# Patient Record
Sex: Male | Born: 1950 | Race: White | Hispanic: No | Marital: Married
Health system: Southern US, Community
[De-identification: ages and names within clinical notes are randomized; demographics above are authoritative.]

---

## 2006-12-02 ENCOUNTER — Ambulatory Visit (HOSPITAL_COMMUNITY): Admission: RE | Admit: 2006-12-02 | Discharge: 2006-12-03 | Payer: Self-pay | Admitting: Neurosurgery

## 2007-04-06 ENCOUNTER — Encounter: Admission: RE | Admit: 2007-04-06 | Discharge: 2007-04-06 | Payer: Self-pay | Admitting: Neurosurgery

## 2007-05-19 ENCOUNTER — Inpatient Hospital Stay (HOSPITAL_COMMUNITY): Admission: RE | Admit: 2007-05-19 | Discharge: 2007-05-20 | Payer: Self-pay | Admitting: Neurosurgery

## 2007-05-20 ENCOUNTER — Ambulatory Visit (HOSPITAL_COMMUNITY): Admission: RE | Admit: 2007-05-20 | Discharge: 2007-05-20 | Payer: Self-pay | Admitting: Neurosurgery

## 2007-06-20 ENCOUNTER — Encounter: Admission: RE | Admit: 2007-06-20 | Discharge: 2007-06-20 | Payer: Self-pay | Admitting: Neurosurgery

## 2007-08-24 ENCOUNTER — Encounter: Admission: RE | Admit: 2007-08-24 | Discharge: 2007-08-24 | Payer: Self-pay | Admitting: Neurosurgery

## 2008-03-28 ENCOUNTER — Encounter: Admission: RE | Admit: 2008-03-28 | Discharge: 2008-03-28 | Payer: Self-pay | Admitting: Neurosurgery

## 2008-05-22 ENCOUNTER — Ambulatory Visit: Payer: Self-pay | Admitting: Family Medicine

## 2008-05-22 DIAGNOSIS — K219 Gastro-esophageal reflux disease without esophagitis: Secondary | ICD-10-CM

## 2008-05-22 DIAGNOSIS — I10 Essential (primary) hypertension: Secondary | ICD-10-CM | POA: Insufficient documentation

## 2008-05-22 DIAGNOSIS — M109 Gout, unspecified: Secondary | ICD-10-CM

## 2008-05-22 DIAGNOSIS — S8010XA Contusion of unspecified lower leg, initial encounter: Secondary | ICD-10-CM | POA: Insufficient documentation

## 2008-05-22 DIAGNOSIS — E785 Hyperlipidemia, unspecified: Secondary | ICD-10-CM | POA: Insufficient documentation

## 2008-12-12 ENCOUNTER — Encounter: Admission: RE | Admit: 2008-12-12 | Discharge: 2008-12-12 | Payer: Self-pay | Admitting: Neurosurgery

## 2009-09-06 IMAGING — CR DG CHEST 2V
2 series · 2 of 2 positions shown · non-contrast
Comparison: 12/01/06.

CLINICAL DATA: 56-year-old female, preadmission for cervical fusion.
 CHEST - 2 VIEW:

[view not recorded (1 of 2)]
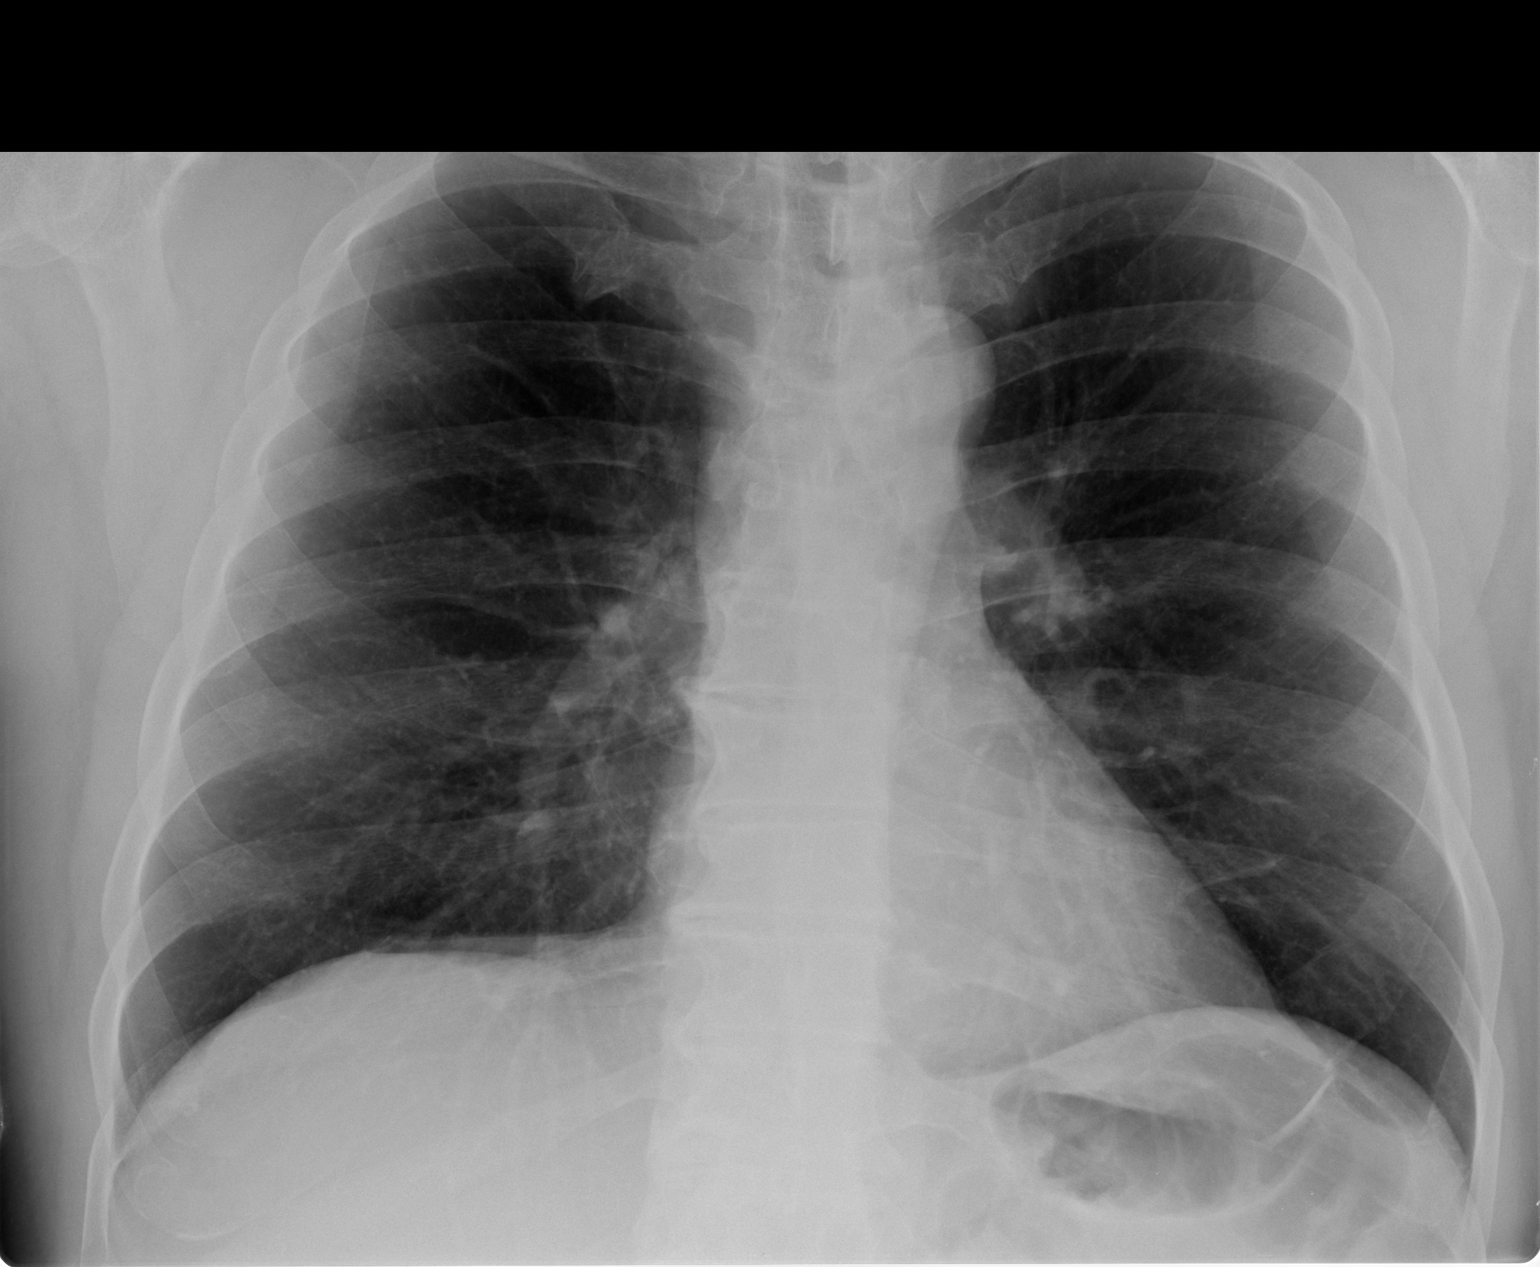

[view not recorded (2 of 2)]
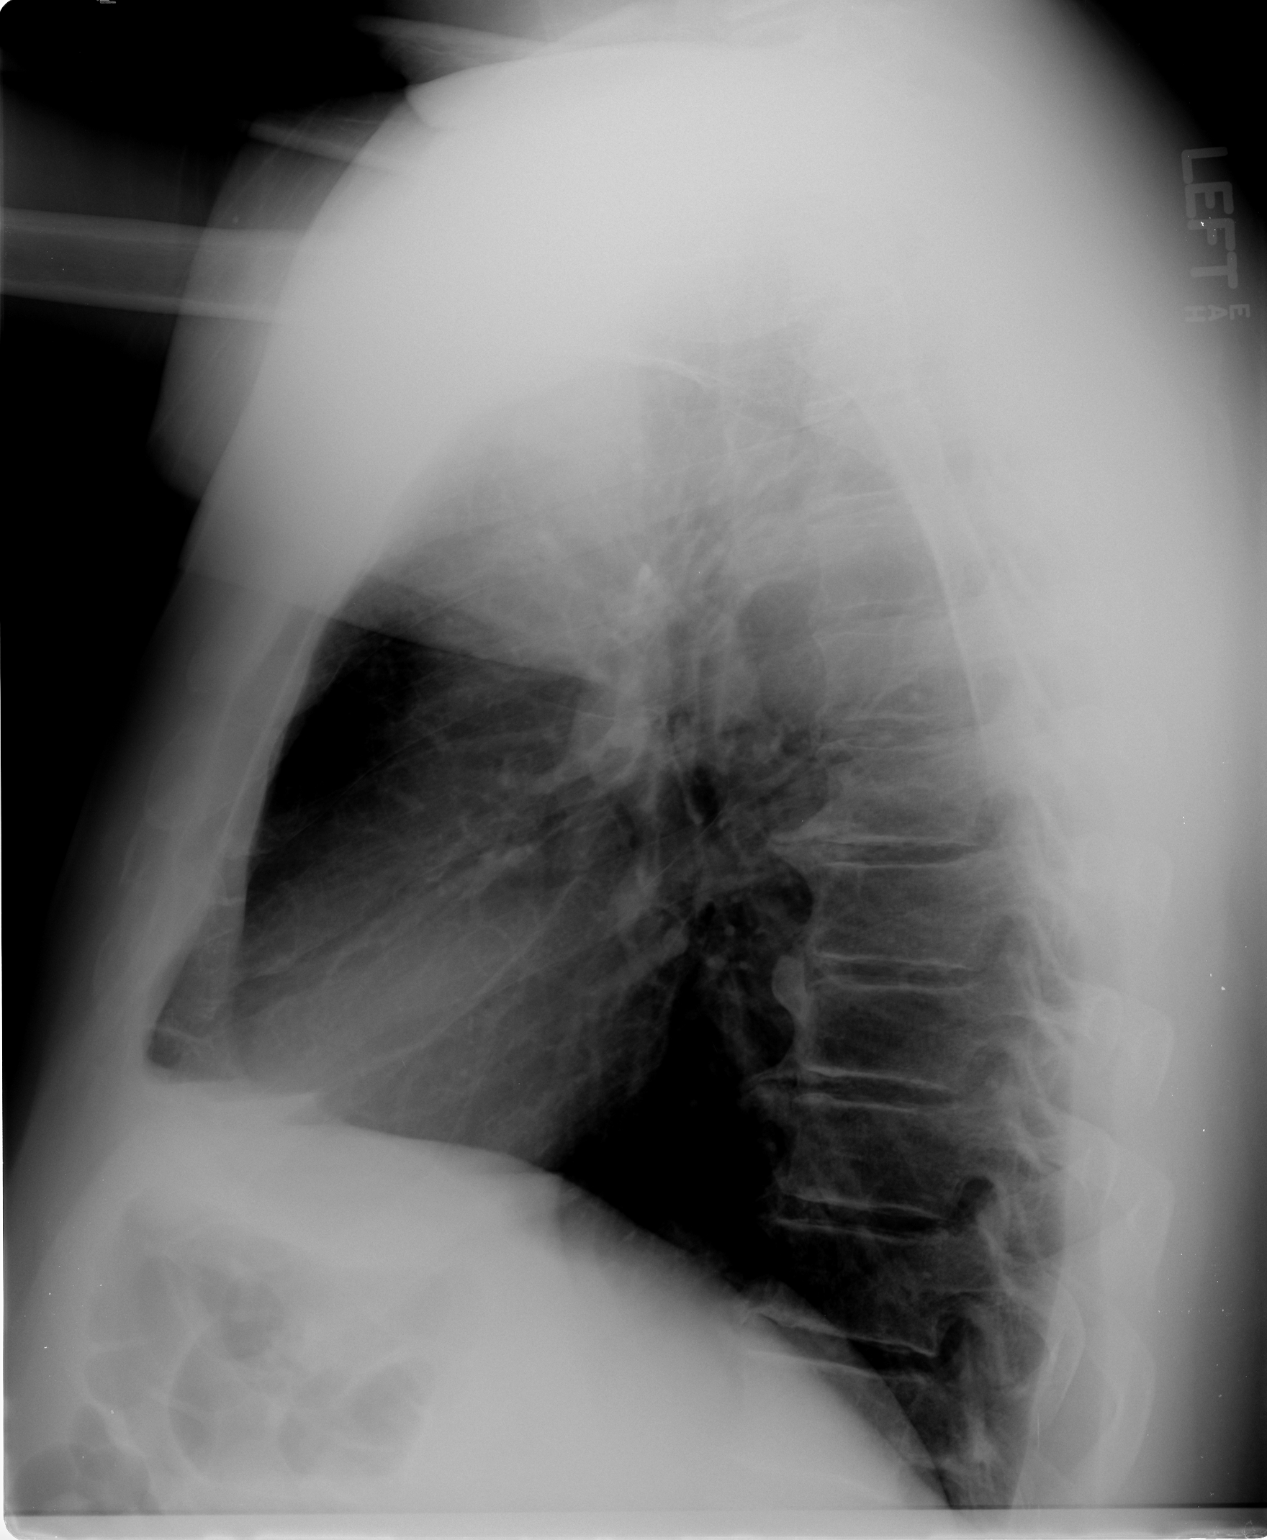

[2 of 2 positions shown; findings below may reference images not displayed]

FINDINGS: Stable, normal cardiac size.  Normal mediastinal contour.  Stable multilevel degenerative changes with osteophytosis in the thoracic spine.  The lungs are clear.
IMPRESSION: No acute cardiopulmonary abnormality.

## 2010-06-29 ENCOUNTER — Encounter: Payer: Self-pay | Admitting: Neurosurgery

## 2010-10-20 NOTE — Op Note (Signed)
NAMEINGVALD, THEISEN                 ACCOUNT NO.:  [id number]   MEDICAL RECORD NO.:  [id number]          PATIENT TYPE:  INP   LOCATION:  3019                         FACILITY:  [facility]   PHYSICIAN:  Donalee Citrin, M.D.        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   PROCEDURE:  Intracervical diskectomies and fusion at C3-4, 4-5, 5-6, and  6-7 using the Cornerstone allograft wedges and a Venture 80 mm plate  with 10 screws.  Eight of them were 15 mm variables, two of them were  13.   SURGEON:  Donalee Citrin, M.D.   Threasa HeadsYetta Barre.   ANESTHESIA:  General endotracheal.   HISTORY OF PRESENT ILLNESS:  Patient is a very pleasant 60 year old  gentleman who has had progressively worsening neck pain with pain  radiating down both arms, worse on the right side in the first fingers  of his right hand.  Patient also had some weakness of his right upper  extremity and weakness in his hands.  MRI scan showed severe cervical  spinal stenosis at multiple levels, to include 3-4, 4-5, 5-6, and 6-7,  causing spinal cord compression at all levels and biforaminal stenosis,  worse on the right at 5-6 and 6-7.  The patient failed all forms of  conservative treatment, and it was recommended to do cervical diskectomy  due to the physical exam and MRI findings.  The risks and benefits of a  four level entry surgery were explained in depth to him.  He understands  and agreed to proceed.   REPORT:  Patient was brought to the OR and was induced under general  anesthesia.  He was placed supine with the neck in slight extension and  5 pounds of __________  traction.  The right side of his neck is prepped  and draped in the usual sterile fashion.  __________  is  localized in  the C5-6 vertebral bodies, so a longitudinal incision was made along the  carotid artery on the anterior border of the sternocleidomastoid to the  midline.  The platysmus was then dissected out and divided  longitudinally.  The avascular __________  was developed down to the  prevertebral fascia.  The prevertebral fascia was then dissected with a  Kitner.   Adequate exposure immediately was obtained at 4-5, 5-6, and 6-7.  There  was noted to be a branch of the external carotid, felt to be the  ascending pharyngeal, that dissection had to be carried out above to  preserve that artery, to expose 3-4.  After this point, the __________  placed.  First, attention was taken at C5-6 and C6-7.  Large anterior  osteophytes were under-bitten off with the Leksell rongeur, then 2-3 mm  Kerrison punch, anterior ostomies were under-bitten off the C5 and C6  vertebral bodies.  Both interspaces  were drilled down the posterior  __________  complex.  Then the operating microscope was draped and  brought onto the field __________ .  The under-surface of both end  plates were aggressively under-bitten.  The posterior longitudinal  ligament and posterior annulus were noted to be calcified and was teased  off of the under-surface of the dura and aggressively under-bitten.  There was noted to be marked stenosis on the thecal sac in both C6 and  C7 neural foramina respectively at each level, and these were all under-  bitten carefully with a 1-2 mm Kerrison punch, decompressing the central  canal to the level of the pedicles on each side and opening up the  foramen widely.  After this was performed at C6-7 with the large  osteophyte removed off the right C7 nerve root, which was felt to be  clearly symptomatic, attention was taken __________  across the left C7  nerve root and then at C5-6 in a similar fashion.  Aggressive under-  biting of the end plates was performed, decompressing the central  canal  in both C6 neural foramen.  A 7 mm graft was inserted at C6-7, and a 6  mm graft was inserted at C5-6, then the retractor was reposition, and 4-  5 was performed in a similar fashion.  Aggressive drilling down the  posterior annulus and posterior annulus, __________  complex with a  large osteophyte coming off The C4 vertebral body causing marked spinal  cord compression.  This was all aggressively under-bitten, decompressing  the central canal and proximal C5 nerve roots bilaterally.  Both neural  foramen were widely opened up with flush with the pedicle, and the C5  end plate was also under-bitten, decompressed in the central canal.  A 5  mm graft was inserted at C4-5, then the retractor was repositioned above  the artery to expose C3-4.  In a similar fashion, C3-4 was __________  central stenosis but aggressively under-biting the end plates,  decompressing the central canal to the level of both C4 pedicles.  Then  a 5 mm graft was inserted at C3-4.  After all grafts had been inserted  and fluoroscopy confirmed good positions of the upper grafts, as the  lower grafts were not able to be visualized on fluoroscopy, attention  was taken to the plate placement.  The operating microscope was removed.  Using an 80 mm Venture plate, this was threaded underneath the artery.  Holes were drilled, and screws were placed at C3.  All screws had  excellent purchase.  The locking mechanism was engaged, then all the  additional screws were placed.  There were two 13 mm placed in 3, and  then in 4, 5, 6, and 7 all had 15 mm variable angle screws.  Then after  all of the screws were in place, fluoroscopy confirmed good position of  the plates, screws, and bone grafts in the upper levels, then the wound  was copiously irrigated.  Meticulous hemostasis was maintained,  Surgifoam was laid to facilitate hemostasis.  Then a J-P drain was  inserted, and the platysma was reapproximated with interrupted  Vicryl,  and the skin was closed with running 4-0 subcuticular Dermabond,  Benzoin, and Steri-Strips.  The patient then went to the recovery room  in stable condition.  At the end of the case, all needle counts were  correct.           ______________________________  Donalee Citrin, M.D.     GC/MEDQ  D:  05/19/2007  T:  05/20/2007  Job:  [id number]

## 2010-10-20 NOTE — Op Note (Signed)
[name], [name]                 ACCOUNT NO.:  [id number]   MEDICAL RECORD NO.:  [id number]          PATIENT TYPE:  AMB   LOCATION:  SDS                          FACILITY:  [facility]   PHYSICIAN:  Donalee Citrin, M.D.        DATE OF BIRTH:  01/27/1951   DATE OF PROCEDURE:  12/02/2006  DATE OF DISCHARGE:                               OPERATIVE REPORT   PREOPERATIVE DIAGNOSIS:  Herniated nucleus pulposus L5-S1 left  radiculopathy.   PROCEDURES:  1. Lumbar laminectomy with microdiskectomy L5-S1, left.  2. Microdissection of the left S1 nerve root.   SURGEON:  Donalee Citrin, M.D.   ASSISTANT:  __________   ANESTHESIA:  General endotracheal.   HISTORY OF PRESENT ILLNESS:  The patient is a very pleasant 60 year old  gentleman with a longstanding left buttock and leg pain radiating down  the outer surface of the buttocks with numbness and tingling in the same  distribution.  Preoperative imaging showed a large ruptured of disk at  L5-S1 on the left, compressive left S1 nerve root.  The patient failed  all forms of conservative treatment with epidural steroid injections,  physical therapy, antiinflammatories and time.  The patient was  subsequently recommended laminectomy and microdiskectomy.  Risks and  benefits of the operation were explained to the patient.  She  understands and agreed to proceed forth.   The patient was brought to the OR and was induced under general  anesthesia, placed up in Wilson frame, back was prepped and draped in  the usual sterile fashion.  Preoperative x-ray localized the L5-S1 disk  space.  After infiltration of 10 mL of lidocaine with epinephrine, a  midline incision made with Bovie electrocautery which was taken down to  subcutaneous.  Subperiosteal dissection was carried out at the lamina of  L5-S1 on the left.  The intraoperative x-ray confirmed localization at  the appropriate level.  Then using high speed drill, the __________  was  identified, medial  facet complex and __________  was drilled down.  Then, using a 2-mm Kerrison punch, the undersurface of the lamina was  removed.  The lateral facet complex was underbitten.  The ligament was  identified, noted to be markedly hypertrophied.  This was all dissected  off the dura with a 4 Penfield and removed in a piecemeal fashion.   At this point, the operative microscope was draped.  Under microscopic  illumination, the mid to the lateral gutter was underbitten, exposing  the proximal S1 nerve root and the S1 pedicle.  The disk space was then  identified and there was a very large disk herniation compressing the  left S1 nerve root.  Epidural branches were coagulated over the disk  space.  Annulotomy was made and immediate free fragments of disk were  removed from the disk space.  After this was directly cleaned out, there  was no further stenosis in the S1 nerve root at L5-S1 interspace and  once adequate decompression was confirmed, the wound was copiously  irrigated and meticulous hemostasis was maintained with Gelfoam soaked  with thrombin in dura.  The muscle and fascia were approximated in  layers with  interrupted Vicryl and the skin was closed with running 4-0 subcuticular  and benzoin and Steri-Strips were applied.  The patient went to the  recovery room in stable condition.  At the end of the case, needle  counts and sponge counts correct.           ______________________________  Donalee Citrin, M.D.     GC/MEDQ  D:  12/02/2006  T:  12/02/2006  Job:  [id number]

## 2011-03-15 LAB — BASIC METABOLIC PANEL
CO2: 26
Calcium: 9.6
Creatinine, Ser: 1.22
GFR calc Af Amer: 55 — ABNORMAL LOW
GFR calc non Af Amer: 46 — ABNORMAL LOW
Glucose, Bld: 97

## 2011-03-15 LAB — CARDIAC PANEL(CRET KIN+CKTOT+MB+TROPI)
CK, MB: 3.2
Total CK: 188
Troponin I: 0.01

## 2011-03-15 LAB — CBC
Hemoglobin: 16.1
MCHC: 35.2

## 2011-03-24 LAB — CBC
HCT: 41.7
MCV: 89.8
RBC: 4.64
RDW: 12.8

## 2011-03-24 LAB — BASIC METABOLIC PANEL
BUN: 26 — ABNORMAL HIGH
CO2: 25
Calcium: 9.2
Chloride: 106
GFR calc non Af Amer: 37 — ABNORMAL LOW
Glucose, Bld: 91
Potassium: 4.3

## 2011-03-24 LAB — PROTIME-INR: Prothrombin Time: 13.7

## 2019-06-26 ENCOUNTER — Ambulatory Visit: Payer: Medicare Other | Attending: Internal Medicine

## 2019-06-26 DIAGNOSIS — Z23 Encounter for immunization: Secondary | ICD-10-CM

## 2019-06-26 NOTE — Progress Notes (Signed)
   Covid-19 Vaccination Clinic  Name:  [name] [name]    MRN: [id number] DOB: Mar 09, 1951  06/26/2019  [name] was observed post Covid-19 immunization for 15 minutes without incidence. He was provided with Vaccine Information Sheet and instruction to access the V-Safe system.   [name] was instructed to call 911 with any severe reactions post vaccine: Marland Kitchen Difficulty breathing  . Swelling of your face and throat  . A fast heartbeat  . A bad rash all over your body  . Dizziness and weakness    Immunizations Administered    Name Date Dose VIS Date Route   Pfizer COVID-19 Vaccine 06/26/2019  5:51 PM 0.3 mL 05/18/2019 Intramuscular   Manufacturer: [employer], [employer]   Lot: [id number]   NDC: [address]-5

## 2019-07-16 ENCOUNTER — Ambulatory Visit: Payer: Medicare Other | Attending: Internal Medicine

## 2019-07-16 DIAGNOSIS — Z23 Encounter for immunization: Secondary | ICD-10-CM | POA: Insufficient documentation

## 2019-07-16 NOTE — Progress Notes (Signed)
   Covid-19 Vaccination Clinic  Name:  [name] [name]    MRN: [id number] DOB: 04/08/1951  07/16/2019  [name] was observed post Covid-19 immunization for 15 minutes without incidence. He was provided with Vaccine Information Sheet and instruction to access the V-Safe system.   [name] was instructed to call 911 with any severe reactions post vaccine: Marland Kitchen Difficulty breathing  . Swelling of your face and throat  . A fast heartbeat  . A bad rash all over your body  . Dizziness and weakness    Immunizations Administered    Name Date Dose VIS Date Route   Pfizer COVID-19 Vaccine 07/16/2019 12:03 PM 0.3 mL 05/18/2019 Intramuscular   Manufacturer: [employer], [employer]   Lot: [id number]   NDC: [address]-2

## 2021-09-05 DEATH — deceased
